# Patient Record
Sex: Female | Born: 1955 | Race: White | Hispanic: No | Marital: Married | Smoking: Never smoker
Health system: Southern US, Community
[De-identification: ages and names within clinical notes are randomized; demographics above are authoritative.]

---

## 1998-04-21 ENCOUNTER — Ambulatory Visit (HOSPITAL_COMMUNITY): Admission: RE | Admit: 1998-04-21 | Discharge: 1998-04-21 | Payer: Self-pay | Admitting: Obstetrics & Gynecology

## 1998-06-19 ENCOUNTER — Ambulatory Visit (HOSPITAL_COMMUNITY): Admission: RE | Admit: 1998-06-19 | Discharge: 1998-06-19 | Payer: Self-pay | Admitting: Obstetrics & Gynecology

## 1999-03-24 ENCOUNTER — Other Ambulatory Visit: Admission: RE | Admit: 1999-03-24 | Discharge: 1999-03-24 | Payer: Self-pay | Admitting: Obstetrics & Gynecology

## 2000-04-03 ENCOUNTER — Encounter: Payer: Self-pay | Admitting: Obstetrics & Gynecology

## 2000-04-03 ENCOUNTER — Ambulatory Visit (HOSPITAL_COMMUNITY): Admission: RE | Admit: 2000-04-03 | Discharge: 2000-04-03 | Payer: Self-pay | Admitting: Obstetrics & Gynecology

## 2000-08-02 ENCOUNTER — Other Ambulatory Visit: Admission: RE | Admit: 2000-08-02 | Discharge: 2000-08-02 | Payer: Self-pay | Admitting: Obstetrics & Gynecology

## 2000-09-18 ENCOUNTER — Inpatient Hospital Stay (HOSPITAL_COMMUNITY): Admission: RE | Admit: 2000-09-18 | Discharge: 2000-09-20 | Payer: Self-pay | Admitting: Obstetrics & Gynecology

## 2000-09-20 ENCOUNTER — Inpatient Hospital Stay (HOSPITAL_COMMUNITY): Admission: EM | Admit: 2000-09-20 | Discharge: 2000-09-22 | Payer: Self-pay | Admitting: Emergency Medicine

## 2001-09-17 ENCOUNTER — Other Ambulatory Visit: Admission: RE | Admit: 2001-09-17 | Discharge: 2001-09-17 | Payer: Self-pay | Admitting: Obstetrics & Gynecology

## 2002-08-28 ENCOUNTER — Ambulatory Visit (HOSPITAL_COMMUNITY): Admission: RE | Admit: 2002-08-28 | Discharge: 2002-08-28 | Payer: Self-pay | Admitting: Obstetrics & Gynecology

## 2002-08-28 ENCOUNTER — Encounter: Payer: Self-pay | Admitting: Obstetrics & Gynecology

## 2002-10-18 ENCOUNTER — Other Ambulatory Visit: Admission: RE | Admit: 2002-10-18 | Discharge: 2002-10-18 | Payer: Self-pay | Admitting: Obstetrics & Gynecology

## 2003-11-04 ENCOUNTER — Other Ambulatory Visit: Admission: RE | Admit: 2003-11-04 | Discharge: 2003-11-04 | Payer: Self-pay | Admitting: Obstetrics & Gynecology

## 2005-02-09 ENCOUNTER — Other Ambulatory Visit: Admission: RE | Admit: 2005-02-09 | Discharge: 2005-02-09 | Payer: Self-pay | Admitting: Obstetrics & Gynecology

## 2006-02-15 ENCOUNTER — Other Ambulatory Visit: Admission: RE | Admit: 2006-02-15 | Discharge: 2006-02-15 | Payer: Self-pay | Admitting: Obstetrics & Gynecology

## 2014-12-04 ENCOUNTER — Other Ambulatory Visit: Payer: Self-pay | Admitting: Obstetrics & Gynecology

## 2014-12-04 DIAGNOSIS — R928 Other abnormal and inconclusive findings on diagnostic imaging of breast: Secondary | ICD-10-CM

## 2014-12-15 ENCOUNTER — Ambulatory Visit
Admission: RE | Admit: 2014-12-15 | Discharge: 2014-12-15 | Disposition: A | Discharge status: Home / Self Care | Payer: 59 | Source: Ambulatory Visit | Attending: Obstetrics & Gynecology | Admitting: Obstetrics & Gynecology

## 2014-12-15 DIAGNOSIS — R928 Other abnormal and inconclusive findings on diagnostic imaging of breast: Secondary | ICD-10-CM

## 2015-05-25 ENCOUNTER — Other Ambulatory Visit: Payer: Self-pay | Admitting: Obstetrics & Gynecology

## 2015-05-25 DIAGNOSIS — N6489 Other specified disorders of breast: Secondary | ICD-10-CM

## 2015-06-16 ENCOUNTER — Ambulatory Visit
Admission: RE | Admit: 2015-06-16 | Discharge: 2015-06-16 | Disposition: A | Discharge status: Home / Self Care | Payer: Commercial Managed Care - HMO | Source: Ambulatory Visit | Attending: Obstetrics & Gynecology | Admitting: Obstetrics & Gynecology

## 2015-06-16 DIAGNOSIS — N6489 Other specified disorders of breast: Secondary | ICD-10-CM

## 2015-11-09 ENCOUNTER — Other Ambulatory Visit: Payer: Self-pay

## 2015-11-09 DIAGNOSIS — Z1231 Encounter for screening mammogram for malignant neoplasm of breast: Secondary | ICD-10-CM

## 2015-12-14 ENCOUNTER — Other Ambulatory Visit: Payer: Self-pay | Admitting: Obstetrics & Gynecology

## 2015-12-14 ENCOUNTER — Ambulatory Visit
Admission: RE | Admit: 2015-12-14 | Discharge: 2015-12-14 | Disposition: A | Discharge status: Home / Self Care | Payer: Commercial Managed Care - HMO | Source: Ambulatory Visit

## 2015-12-14 DIAGNOSIS — N6489 Other specified disorders of breast: Secondary | ICD-10-CM

## 2015-12-14 DIAGNOSIS — Z1231 Encounter for screening mammogram for malignant neoplasm of breast: Secondary | ICD-10-CM

## 2015-12-28 ENCOUNTER — Ambulatory Visit
Admission: RE | Admit: 2015-12-28 | Discharge: 2015-12-28 | Disposition: A | Discharge status: Home / Self Care | Payer: Commercial Managed Care - HMO | Source: Ambulatory Visit | Attending: Obstetrics & Gynecology | Admitting: Obstetrics & Gynecology

## 2015-12-28 DIAGNOSIS — N6489 Other specified disorders of breast: Secondary | ICD-10-CM

## 2016-12-06 ENCOUNTER — Other Ambulatory Visit: Payer: Self-pay | Admitting: Obstetrics & Gynecology

## 2016-12-06 DIAGNOSIS — N644 Mastodynia: Secondary | ICD-10-CM

## 2016-12-07 DIAGNOSIS — Z01419 Encounter for gynecological examination (general) (routine) without abnormal findings: Secondary | ICD-10-CM | POA: Diagnosis not present

## 2016-12-09 ENCOUNTER — Other Ambulatory Visit: Payer: Self-pay | Admitting: Obstetrics & Gynecology

## 2016-12-09 DIAGNOSIS — N644 Mastodynia: Secondary | ICD-10-CM

## 2016-12-28 ENCOUNTER — Ambulatory Visit
Admission: RE | Admit: 2016-12-28 | Discharge: 2016-12-28 | Disposition: A | Discharge status: Home / Self Care | Payer: Commercial Managed Care - HMO | Source: Ambulatory Visit | Attending: Obstetrics & Gynecology | Admitting: Obstetrics & Gynecology

## 2016-12-28 DIAGNOSIS — N644 Mastodynia: Secondary | ICD-10-CM

## 2017-02-08 DIAGNOSIS — R030 Elevated blood-pressure reading, without diagnosis of hypertension: Secondary | ICD-10-CM | POA: Diagnosis not present

## 2017-02-08 DIAGNOSIS — E038 Other specified hypothyroidism: Secondary | ICD-10-CM | POA: Diagnosis not present

## 2017-02-28 DIAGNOSIS — Z23 Encounter for immunization: Secondary | ICD-10-CM | POA: Diagnosis not present

## 2017-06-19 DIAGNOSIS — L03031 Cellulitis of right toe: Secondary | ICD-10-CM | POA: Diagnosis not present

## 2017-08-09 ENCOUNTER — Ambulatory Visit (INDEPENDENT_AMBULATORY_CARE_PROVIDER_SITE_OTHER): Payer: 59 | Admitting: Orthopedic Surgery

## 2017-08-09 ENCOUNTER — Encounter (INDEPENDENT_AMBULATORY_CARE_PROVIDER_SITE_OTHER): Payer: Self-pay | Admitting: Orthopedic Surgery

## 2017-08-09 ENCOUNTER — Ambulatory Visit (INDEPENDENT_AMBULATORY_CARE_PROVIDER_SITE_OTHER): Payer: 59

## 2017-08-09 DIAGNOSIS — M25561 Pain in right knee: Secondary | ICD-10-CM

## 2017-08-12 DIAGNOSIS — M25561 Pain in right knee: Secondary | ICD-10-CM | POA: Diagnosis not present

## 2017-08-12 MED ORDER — LIDOCAINE HCL 1 % IJ SOLN
5.0000 mL | INTRAMUSCULAR | Status: AC | PRN
  Administered 2017-08-12: 5 mL

## 2017-08-12 MED ORDER — METHYLPREDNISOLONE ACETATE 40 MG/ML IJ SUSP
40.0000 mg | INTRAMUSCULAR | Status: AC | PRN
  Administered 2017-08-12: 40 mg via INTRA_ARTICULAR

## 2017-08-12 MED ORDER — BUPIVACAINE HCL 0.25 % IJ SOLN
4.0000 mL | INTRAMUSCULAR | Status: AC | PRN
  Administered 2017-08-12: 4 mL via INTRA_ARTICULAR

## 2017-08-12 NOTE — Progress Notes (Signed)
Office Visit Note   Patient: Whitney Small           Date of Birth: August 21, 1956           MRN: [id number] Visit Date: 08/09/2017 Requested by: No referring provider defined for this encounter. PCP: Alysia Penna, MD  Subjective: Chief Complaint  Patient presents with  . Right Knee - Pain    HPI: Whitney Small is a 61 year old female with 6 week history of right knee pain.  She had been cleaning painting exercising walking on vacation and that she was at her mother's and had a deep knee squat and felt a pop which has affected the medial aspect of her knee.  She is retired.  She takes ibuprofen for the problem.  She is been doing training on the bike.  When she walks too fast since that accident she reports sharp pain on the side of her knee.  She is going to [place] in November which involves a lot of walking around.  No waking from sleep from the pain.              ROS: All systems reviewed are negative as they relate to the chief complaint within the history of present illness.  Patient denies  fevers or chills.   Assessment & Plan: Visit Diagnoses:  1. Right knee pain, unspecified chronicity     Plan: Impression is pop right knee medial tenderness and mechanism is consistent with medial meniscal tear and normal radiographs.  She does have a trace effusion in the knee.  Pain is relatively severe.  Plan at this time is to aspirate and inject that knee and obtained an MRI scan of the knee.  Looking for meniscal root tear versus degenerative tear of the meniscus I'll see her back after that study  Follow-Up Instructions: Return for after MRI.   Orders:  Orders Placed This Encounter  Procedures  . XR KNEE 3 VIEW RIGHT  . MR Knee Right w/o contrast   No orders of the defined types were placed in this encounter.     Procedures: Large Joint Inj Date/Time: 08/12/2017 12:32 PM Performed by: Cammy Copa Authorized by: Cammy Copa   Consent Given by:   Patient Site marked: the procedure site was marked   Timeout: prior to procedure the correct patient, procedure, and site was verified   Indications:  Pain, joint swelling and diagnostic evaluation Location:  Knee Site:  R knee Prep: patient was prepped and draped in usual sterile fashion   Needle Size:  18 G Needle Length:  1.5 inches Approach:  Superolateral Ultrasound Guidance: No   Fluoroscopic Guidance: No   Arthrogram: No   Medications:  5 mL lidocaine 1 %; 4 mL bupivacaine 0.25 %; 40 mg methylPREDNISolone acetate 40 MG/ML Aspiration Attempted: Yes   Aspirate amount (mL):  20 Patient tolerance:  Patient tolerated the procedure well with no immediate complications     Clinical Data: No additional findings.  Objective: Vital Signs: There were no vitals taken for this visit.  Physical Exam:   Constitutional: Patient appears well-developed HEENT:  Head: Normocephalic Eyes:EOM are normal Neck: Normal range of motion Cardiovascular: Normal rate Pulmonary/chest: Effort normal Neurologic: Patient is alert Skin: Skin is warm Psychiatric: Patient has normal mood and affect    Ortho Exam: Orthopedic exam demonstrates full active and passive range of motion of the knee with mild effusion stable collateral and cruciate ligaments positive McMurray compression testing for medial compartment pathology intact  anterior cruciate ligament and PCL no other masses lymph adenopathy or skin changes noted in the right knee region  Specialty Comments:  No specialty comments available.  Imaging: No results found.   PMFS History: There are no active problems to display for this patient.  No past medical history on file.  No family history on file.  No past surgical history on file. Social History   Occupational History  . Not on file.   Social History Main Topics  . Smoking status: Never Smoker  . Smokeless tobacco: Never Used  . Alcohol use Not on file  . Drug use:  Unknown  . Sexual activity: Not on file

## 2017-08-19 ENCOUNTER — Ambulatory Visit
Admission: RE | Admit: 2017-08-19 | Discharge: 2017-08-19 | Disposition: A | Discharge status: Home / Self Care | Payer: Commercial Managed Care - HMO | Source: Ambulatory Visit | Attending: Orthopedic Surgery | Admitting: Orthopedic Surgery

## 2017-08-19 DIAGNOSIS — M25461 Effusion, right knee: Secondary | ICD-10-CM | POA: Diagnosis not present

## 2017-08-19 DIAGNOSIS — M25561 Pain in right knee: Secondary | ICD-10-CM

## 2017-08-23 ENCOUNTER — Encounter (INDEPENDENT_AMBULATORY_CARE_PROVIDER_SITE_OTHER): Payer: Self-pay | Admitting: Orthopedic Surgery

## 2017-08-23 ENCOUNTER — Ambulatory Visit (INDEPENDENT_AMBULATORY_CARE_PROVIDER_SITE_OTHER): Payer: 59 | Admitting: Orthopedic Surgery

## 2017-08-23 DIAGNOSIS — M25561 Pain in right knee: Secondary | ICD-10-CM

## 2017-08-26 NOTE — Progress Notes (Signed)
Office Visit Note   Patient: Whitney Small           Date of Birth: Aug 24, 1956           MRN: [id number] Visit Date: 08/23/2017 Requested by: Alysia Penna, MD [address] [place], [place] [address] PCP: Alysia Penna, MD  Subjective: Chief Complaint  Patient presents with  . Right Knee - Follow-up    HPI: Whitney Small is a 61 year old patient with right knee pain.  MRI scan has been done since I have seen her.  Shows mild to moderate chondrosis but no definite meniscal tear.  Injection 08/09/2017 helped a lot.  She has watched her grandchildren play soccer.  Currently on no medication for the problem              ROS: All systems reviewed are negative as they relate to the chief complaint within the history of present illness.  Patient denies  fevers or chills.   Assessment & Plan: Visit Diagnoses:  1. Right knee pain, unspecified chronicity     Plan: Impression is right knee pain moderate chondrosis by MRI scan with significant improvement in symptoms from injection.  No operative intervention at this time in the right knee.  Continue with nonweightbearing quad strengthening exercises and follow up as needed  Follow-Up Instructions: Return if symptoms worsen or fail to improve.   Orders:  No orders of the defined types were placed in this encounter.  No orders of the defined types were placed in this encounter.     Procedures: No procedures performed   Clinical Data: No additional findings.  Objective: Vital Signs: There were no vitals taken for this visit.  Physical Exam:   Constitutional: Patient appears well-developed HEENT:  Head: Normocephalic Eyes:EOM are normal Neck: Normal range of motion Cardiovascular: Normal rate Pulmonary/chest: Effort normal Neurologic: Patient is alert Skin: Skin is warm Psychiatric: Patient has normal mood and affect    Ortho Exam: Orthopedic examination of the right knee demonstrates no effusion full range of  motion stable collateral crucial ligaments intact extensor mechanism palpable pedal pulses no masses lymph adenopathy or skin changes noted in the right knee region  Specialty Comments:  No specialty comments available.  Imaging: No results found.   PMFS History: There are no active problems to display for this patient.  No past medical history on file.  No family history on file.  No past surgical history on file. Social History   Occupational History  . Not on file.   Social History Main Topics  . Smoking status: Never Smoker  . Smokeless tobacco: Never Used  . Alcohol use Not on file  . Drug use: Unknown  . Sexual activity: Not on file

## 2017-09-04 DIAGNOSIS — Z23 Encounter for immunization: Secondary | ICD-10-CM | POA: Diagnosis not present

## 2017-09-04 DIAGNOSIS — E039 Hypothyroidism, unspecified: Secondary | ICD-10-CM | POA: Diagnosis not present

## 2017-10-04 DIAGNOSIS — Z0000 Encounter for general adult medical examination without abnormal findings: Secondary | ICD-10-CM | POA: Diagnosis not present

## 2017-10-11 DIAGNOSIS — Z0000 Encounter for general adult medical examination without abnormal findings: Secondary | ICD-10-CM | POA: Diagnosis not present

## 2017-10-11 DIAGNOSIS — M25461 Effusion, right knee: Secondary | ICD-10-CM | POA: Diagnosis not present

## 2017-10-11 DIAGNOSIS — E038 Other specified hypothyroidism: Secondary | ICD-10-CM | POA: Diagnosis not present

## 2017-10-11 DIAGNOSIS — Z1389 Encounter for screening for other disorder: Secondary | ICD-10-CM | POA: Diagnosis not present

## 2017-10-12 ENCOUNTER — Ambulatory Visit (INDEPENDENT_AMBULATORY_CARE_PROVIDER_SITE_OTHER): Payer: 59 | Admitting: Orthopedic Surgery

## 2017-10-12 ENCOUNTER — Encounter (INDEPENDENT_AMBULATORY_CARE_PROVIDER_SITE_OTHER): Payer: Self-pay | Admitting: Orthopedic Surgery

## 2017-10-12 DIAGNOSIS — M25561 Pain in right knee: Secondary | ICD-10-CM

## 2017-10-12 MED ORDER — BUPIVACAINE HCL 0.25 % IJ SOLN
4.0000 mL | INTRAMUSCULAR | Status: AC | PRN
  Administered 2017-10-12: 4 mL via INTRA_ARTICULAR

## 2017-10-12 MED ORDER — METHYLPREDNISOLONE ACETATE 40 MG/ML IJ SUSP
40.0000 mg | INTRAMUSCULAR | Status: AC | PRN
Start: 2017-10-12 — End: 2017-10-12
  Administered 2017-10-12: 40 mg via INTRA_ARTICULAR

## 2017-10-12 MED ORDER — LIDOCAINE HCL 1 % IJ SOLN
5.0000 mL | INTRAMUSCULAR | Status: AC | PRN
  Administered 2017-10-12: 5 mL

## 2017-10-12 MED ORDER — IBUPROFEN-FAMOTIDINE 800-26.6 MG PO TABS: ORAL_TABLET | ORAL | 0 refills | Status: AC

## 2017-10-12 NOTE — Progress Notes (Signed)
Office Visit Note   Patient: Whitney Small           Date of Birth: September 01, 1956           MRN: [id number] Visit Date: 10/12/2017 Requested by: Alysia Penna, MD [address] [place], [place] [address] PCP: Alysia Penna, MD  Subjective: Chief Complaint  Patient presents with  . Right Knee - Follow-up    HPI: Whitney Small is a 61 year old patient with right knee pain.  She had the knee aspirated and injected in September.  This helps.  She is going to [employer] in a week and will be doing a lot of walking.  She reports swelling tightness and difficulty bending in the right knee.              ROS: All systems reviewed are negative as they relate to the chief complaint within the history of present illness.  Patient denies  fevers or chills.   Assessment & Plan: Visit Diagnoses:  1. Right knee pain, unspecified chronicity     Plan: Impression is right knee pain and effusion with flareup of arthritis.  Plan is aspiration and injection today along with Duexis prescription.  I will see her back as needed.  Continue with nonweightbearing quad strengthening exercises  Follow-Up Instructions: Return if symptoms worsen or fail to improve.   Orders:  No orders of the defined types were placed in this encounter.  Meds ordered this encounter  Medications  . Ibuprofen-Famotidine (DUEXIS) 800-26.6 MG TABS    Sig: 1 po qd to bid prn    Dispense:  60 tablet    Refill:  0      Procedures: Large Joint Inj: R knee on 10/12/2017 2:46 PM Indications: diagnostic evaluation, joint swelling and pain Details: 18 G 1.5 in needle, superolateral approach  Arthrogram: No  Medications: 5 mL lidocaine 1 %; 40 mg methylPREDNISolone acetate 40 MG/ML; 4 mL bupivacaine 0.25 % Aspirate: 30 mL yellow Outcome: tolerated well, no immediate complications Procedure, treatment alternatives, risks and benefits explained, specific risks discussed. Consent was given by the patient. Immediately prior to  procedure a time out was called to verify the correct patient, procedure, equipment, support staff and site/side marked as required. Patient was prepped and draped in the usual sterile fashion.       Clinical Data: No additional findings.  Objective: Vital Signs: There were no vitals taken for this visit.  Physical Exam:   Constitutional: Patient appears well-developed HEENT:  Head: Normocephalic Eyes:EOM are normal Neck: Normal range of motion Cardiovascular: Normal rate Pulmonary/chest: Effort normal Neurologic: Patient is alert Skin: Skin is warm Psychiatric: Patient has normal mood and affect    Ortho Exam: Orthopedic exam demonstrates full active and passive range of motion of the right knee with mild effusion present in collateral crucial ligaments are stable extensor mechanism is intact.  No other masses lymph adenopathy or skin changes noted in the right knee region  Specialty Comments:  No specialty comments available.  Imaging: No results found.   PMFS History: There are no active problems to display for this patient.  History reviewed. No pertinent past medical history.  History reviewed. No pertinent family history.  History reviewed. No pertinent surgical history. Social History   Occupational History  . Not on file  Tobacco Use  . Smoking status: Never Smoker  . Smokeless tobacco: Never Used  Substance and Sexual Activity  . Alcohol use: Not on file  . Drug use: Not on file  .  Sexual activity: Not on file

## 2017-12-11 DIAGNOSIS — Z01419 Encounter for gynecological examination (general) (routine) without abnormal findings: Secondary | ICD-10-CM | POA: Diagnosis not present

## 2018-02-06 ENCOUNTER — Other Ambulatory Visit: Payer: Self-pay | Admitting: Obstetrics & Gynecology

## 2018-02-06 DIAGNOSIS — Z1231 Encounter for screening mammogram for malignant neoplasm of breast: Secondary | ICD-10-CM

## 2018-02-12 ENCOUNTER — Ambulatory Visit
Admission: RE | Admit: 2018-02-12 | Discharge: 2018-02-12 | Disposition: A | Discharge status: Home / Self Care | Payer: 59 | Source: Ambulatory Visit | Attending: Obstetrics & Gynecology | Admitting: Obstetrics & Gynecology

## 2018-02-12 DIAGNOSIS — Z1231 Encounter for screening mammogram for malignant neoplasm of breast: Secondary | ICD-10-CM

## 2018-08-30 DIAGNOSIS — Z6825 Body mass index (BMI) 25.0-25.9, adult: Secondary | ICD-10-CM | POA: Diagnosis not present

## 2018-08-30 DIAGNOSIS — R6889 Other general symptoms and signs: Secondary | ICD-10-CM | POA: Diagnosis not present

## 2018-09-05 DIAGNOSIS — E039 Hypothyroidism, unspecified: Secondary | ICD-10-CM | POA: Diagnosis not present

## 2018-09-05 DIAGNOSIS — Z23 Encounter for immunization: Secondary | ICD-10-CM | POA: Diagnosis not present

## 2018-10-05 DIAGNOSIS — Z0000 Encounter for general adult medical examination without abnormal findings: Secondary | ICD-10-CM | POA: Diagnosis not present

## 2018-10-10 DIAGNOSIS — R6889 Other general symptoms and signs: Secondary | ICD-10-CM | POA: Diagnosis not present

## 2018-10-10 DIAGNOSIS — Z0000 Encounter for general adult medical examination without abnormal findings: Secondary | ICD-10-CM | POA: Diagnosis not present

## 2018-10-10 DIAGNOSIS — E038 Other specified hypothyroidism: Secondary | ICD-10-CM | POA: Diagnosis not present

## 2018-10-10 DIAGNOSIS — Z1389 Encounter for screening for other disorder: Secondary | ICD-10-CM | POA: Diagnosis not present

## 2018-12-18 DIAGNOSIS — Z01419 Encounter for gynecological examination (general) (routine) without abnormal findings: Secondary | ICD-10-CM | POA: Diagnosis not present

## 2018-12-18 DIAGNOSIS — Z6825 Body mass index (BMI) 25.0-25.9, adult: Secondary | ICD-10-CM | POA: Diagnosis not present

## 2018-12-25 DIAGNOSIS — Z1212 Encounter for screening for malignant neoplasm of rectum: Secondary | ICD-10-CM | POA: Diagnosis not present

## 2018-12-25 DIAGNOSIS — Z1211 Encounter for screening for malignant neoplasm of colon: Secondary | ICD-10-CM | POA: Diagnosis not present

## 2019-01-03 ENCOUNTER — Other Ambulatory Visit: Payer: Self-pay | Admitting: Obstetrics & Gynecology

## 2019-01-03 DIAGNOSIS — Z1231 Encounter for screening mammogram for malignant neoplasm of breast: Secondary | ICD-10-CM

## 2019-02-15 ENCOUNTER — Ambulatory Visit: Payer: 59

## 2019-03-11 ENCOUNTER — Ambulatory Visit: Payer: 59

## 2019-04-23 ENCOUNTER — Other Ambulatory Visit: Payer: Self-pay

## 2019-04-23 ENCOUNTER — Ambulatory Visit
Admission: RE | Admit: 2019-04-23 | Discharge: 2019-04-23 | Disposition: A | Discharge status: Home / Self Care | Payer: 59 | Source: Ambulatory Visit | Attending: Obstetrics & Gynecology | Admitting: Obstetrics & Gynecology

## 2019-04-23 DIAGNOSIS — Z1231 Encounter for screening mammogram for malignant neoplasm of breast: Secondary | ICD-10-CM | POA: Diagnosis not present

## 2020-06-15 ENCOUNTER — Other Ambulatory Visit: Payer: Self-pay | Admitting: Obstetrics & Gynecology

## 2020-06-15 DIAGNOSIS — Z1231 Encounter for screening mammogram for malignant neoplasm of breast: Secondary | ICD-10-CM

## 2020-06-23 ENCOUNTER — Other Ambulatory Visit: Payer: Self-pay

## 2020-06-23 ENCOUNTER — Ambulatory Visit
Admission: RE | Admit: 2020-06-23 | Discharge: 2020-06-23 | Disposition: A | Discharge status: Home / Self Care | Payer: 59 | Source: Ambulatory Visit | Attending: Obstetrics & Gynecology | Admitting: Obstetrics & Gynecology

## 2020-06-23 DIAGNOSIS — Z1231 Encounter for screening mammogram for malignant neoplasm of breast: Secondary | ICD-10-CM

## 2020-09-07 DIAGNOSIS — E039 Hypothyroidism, unspecified: Secondary | ICD-10-CM | POA: Diagnosis not present

## 2020-09-14 DIAGNOSIS — Z23 Encounter for immunization: Secondary | ICD-10-CM | POA: Diagnosis not present

## 2020-09-14 DIAGNOSIS — E039 Hypothyroidism, unspecified: Secondary | ICD-10-CM | POA: Diagnosis not present

## 2020-10-23 DIAGNOSIS — E039 Hypothyroidism, unspecified: Secondary | ICD-10-CM | POA: Diagnosis not present

## 2020-10-30 DIAGNOSIS — Z0000 Encounter for general adult medical examination without abnormal findings: Secondary | ICD-10-CM | POA: Diagnosis not present

## 2020-10-30 DIAGNOSIS — E039 Hypothyroidism, unspecified: Secondary | ICD-10-CM | POA: Diagnosis not present

## 2020-12-20 IMAGING — MG DIGITAL SCREENING BILAT W/ TOMO W/ CAD
8 series · 8 of 24 positions shown · non-contrast
Comparison: Previous exam(s).

CLINICAL DATA: Screening.

EXAM:
DIGITAL SCREENING BILATERAL MAMMOGRAM WITH TOMO AND CAD

[L CC synth-2D]
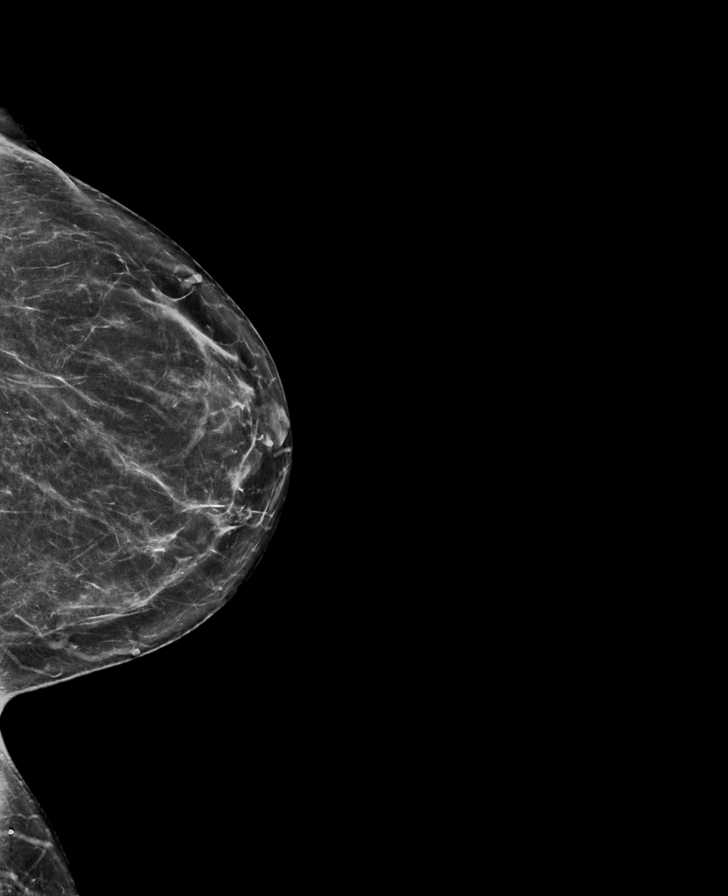

[L MLO synth-2D]
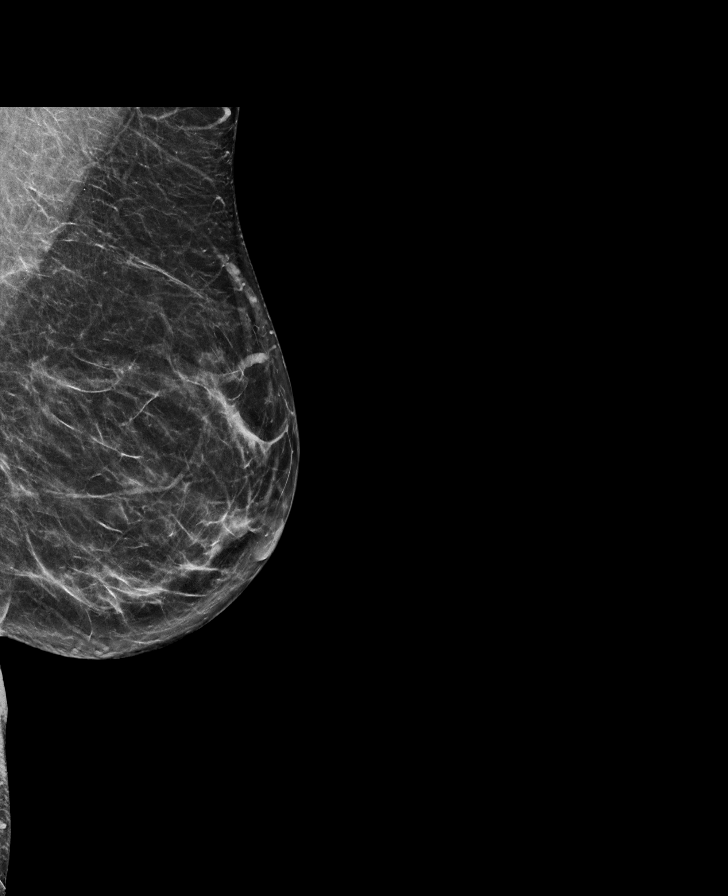

[R CC synth-2D]
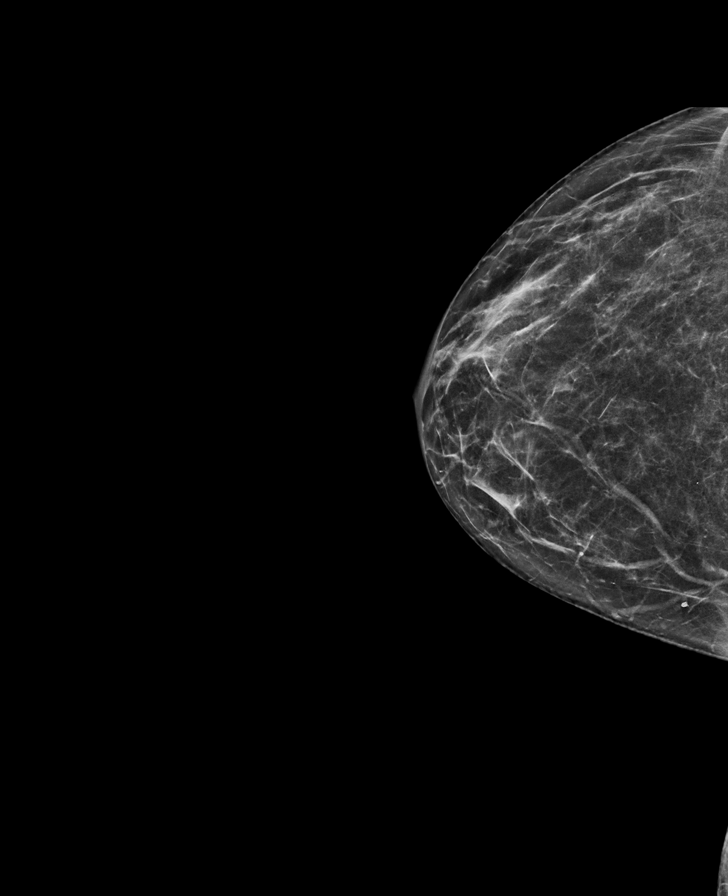

[R MLO synth-2D]
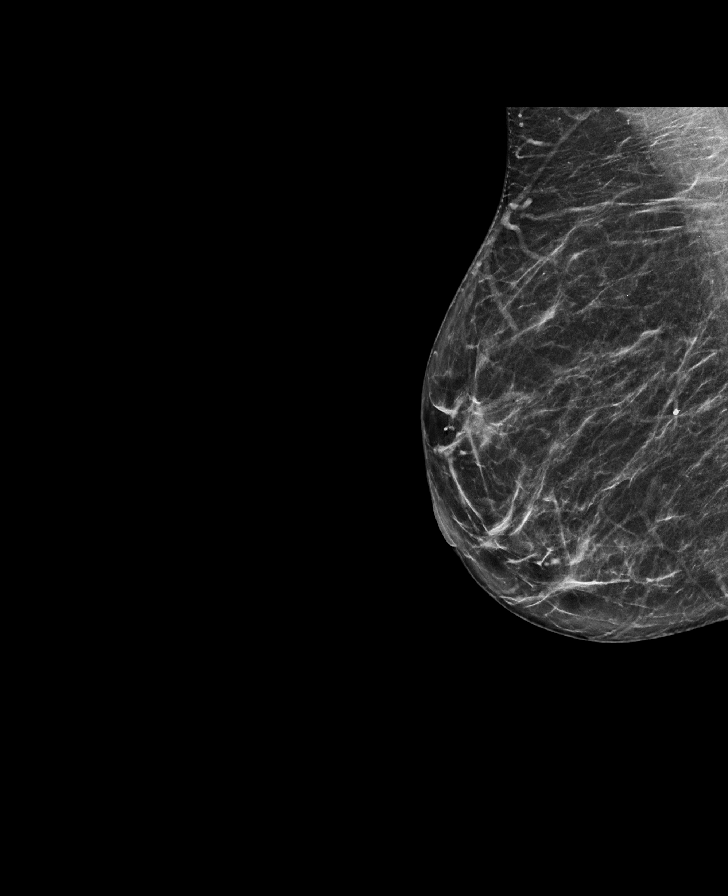

[L CC tomo · tomo slice 34/67.0]
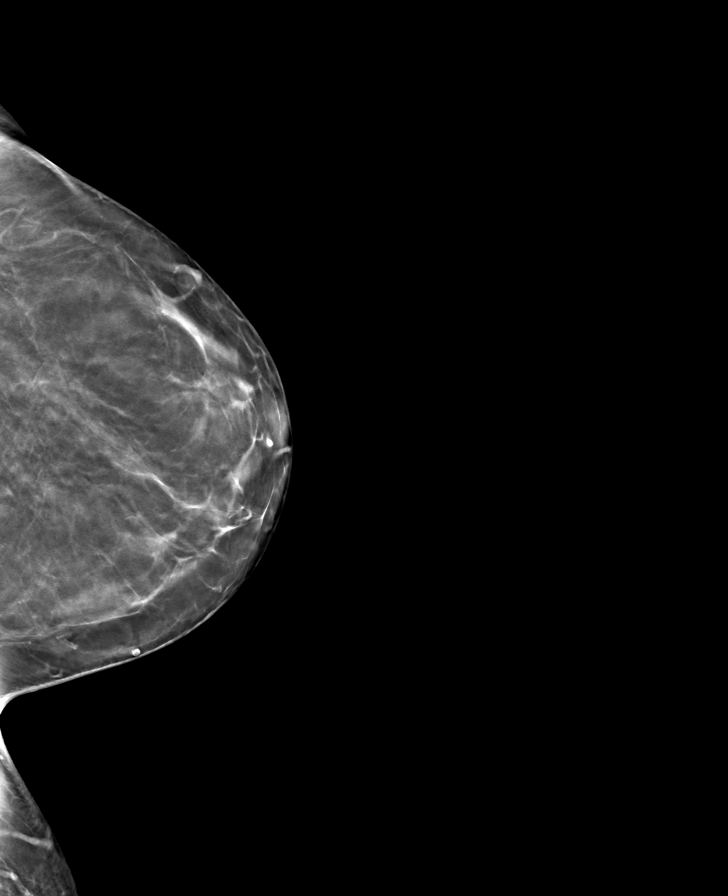

[R MLO tomo · tomo slice 33/65.0]
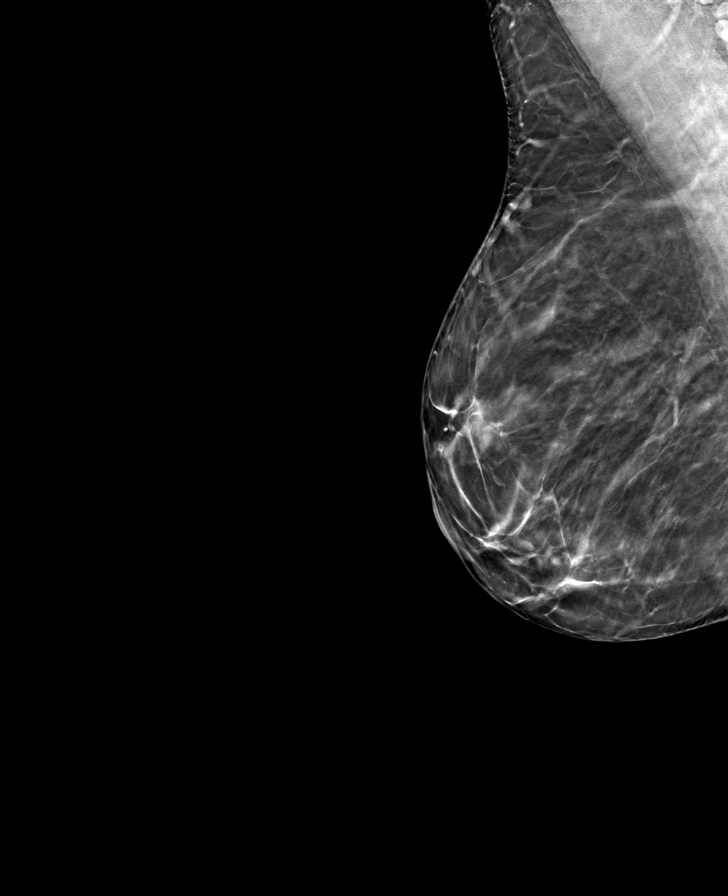

[L MLO tomo · tomo slice 35/68.0]
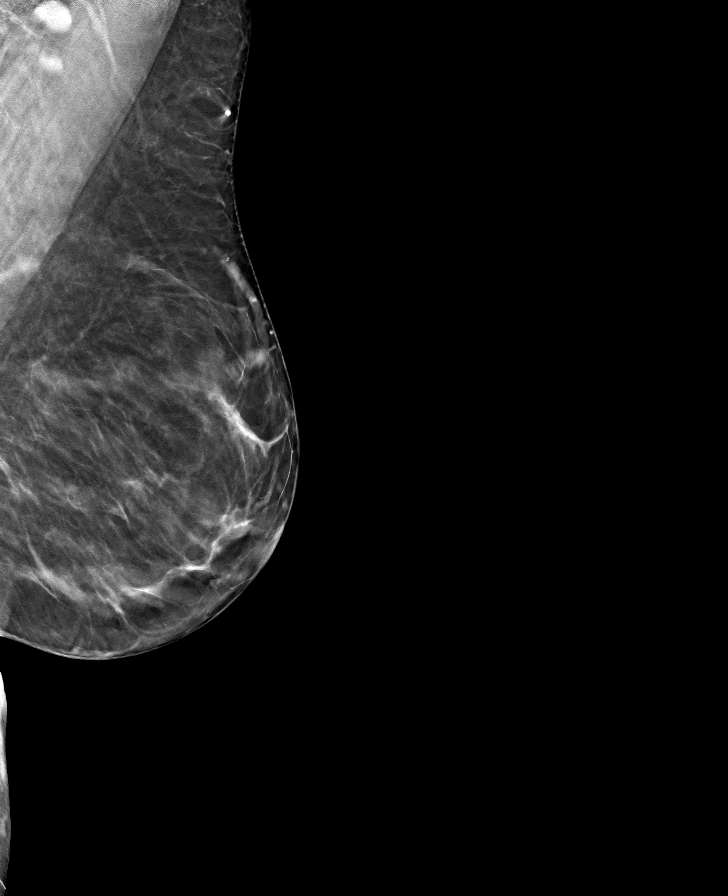

[R CC tomo · tomo slice 32/63.0]
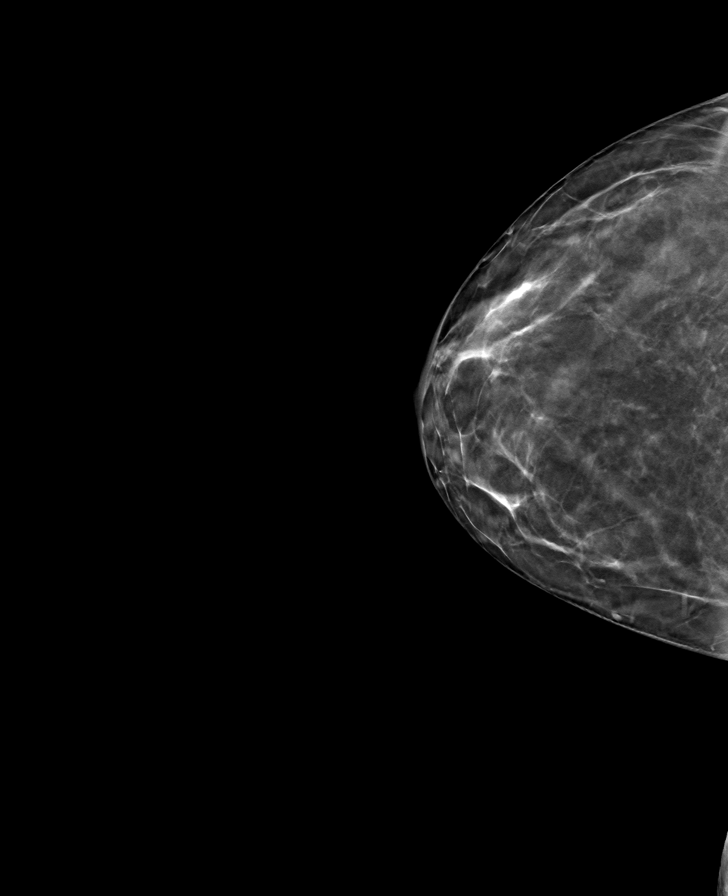

[8 of 24 positions shown; findings below may reference images not displayed]

ACR Breast Density Category c: The breast tissue is heterogeneously
dense, which may obscure small masses.
FINDINGS: There are no findings suspicious for malignancy. Images were
processed with CAD.
IMPRESSION: No mammographic evidence of malignancy. A result letter of this
screening mammogram will be mailed directly to the patient.

RECOMMENDATION:
Screening mammogram in one year. (Code:FT-U-LHB)

BI-RADS CATEGORY  1: Negative.

## 2020-12-28 DIAGNOSIS — Z01419 Encounter for gynecological examination (general) (routine) without abnormal findings: Secondary | ICD-10-CM | POA: Diagnosis not present

## 2020-12-28 DIAGNOSIS — Z6826 Body mass index (BMI) 26.0-26.9, adult: Secondary | ICD-10-CM | POA: Diagnosis not present

## 2021-08-31 DIAGNOSIS — E039 Hypothyroidism, unspecified: Secondary | ICD-10-CM | POA: Diagnosis not present

## 2021-09-14 DIAGNOSIS — Z23 Encounter for immunization: Secondary | ICD-10-CM | POA: Diagnosis not present

## 2021-09-14 DIAGNOSIS — E039 Hypothyroidism, unspecified: Secondary | ICD-10-CM | POA: Diagnosis not present

## 2021-11-30 DIAGNOSIS — E039 Hypothyroidism, unspecified: Secondary | ICD-10-CM | POA: Diagnosis not present

## 2021-11-30 DIAGNOSIS — R030 Elevated blood-pressure reading, without diagnosis of hypertension: Secondary | ICD-10-CM | POA: Diagnosis not present

## 2021-11-30 DIAGNOSIS — R7989 Other specified abnormal findings of blood chemistry: Secondary | ICD-10-CM | POA: Diagnosis not present

## 2021-12-07 DIAGNOSIS — Z0000 Encounter for general adult medical examination without abnormal findings: Secondary | ICD-10-CM | POA: Diagnosis not present

## 2021-12-07 DIAGNOSIS — E039 Hypothyroidism, unspecified: Secondary | ICD-10-CM | POA: Diagnosis not present

## 2021-12-07 DIAGNOSIS — Z1331 Encounter for screening for depression: Secondary | ICD-10-CM | POA: Diagnosis not present

## 2021-12-07 DIAGNOSIS — Z1339 Encounter for screening examination for other mental health and behavioral disorders: Secondary | ICD-10-CM | POA: Diagnosis not present

## 2022-01-03 ENCOUNTER — Other Ambulatory Visit: Payer: Self-pay | Admitting: Obstetrics & Gynecology

## 2022-01-03 DIAGNOSIS — Z803 Family history of malignant neoplasm of breast: Secondary | ICD-10-CM

## 2022-01-20 DIAGNOSIS — Z124 Encounter for screening for malignant neoplasm of cervix: Secondary | ICD-10-CM | POA: Diagnosis not present

## 2022-01-20 DIAGNOSIS — Z1231 Encounter for screening mammogram for malignant neoplasm of breast: Secondary | ICD-10-CM | POA: Diagnosis not present

## 2022-01-20 DIAGNOSIS — Z1272 Encounter for screening for malignant neoplasm of vagina: Secondary | ICD-10-CM | POA: Diagnosis not present

## 2022-01-20 DIAGNOSIS — Z6826 Body mass index (BMI) 26.0-26.9, adult: Secondary | ICD-10-CM | POA: Diagnosis not present

## 2022-01-26 DIAGNOSIS — M8588 Other specified disorders of bone density and structure, other site: Secondary | ICD-10-CM | POA: Diagnosis not present

## 2022-01-26 DIAGNOSIS — Z8262 Family history of osteoporosis: Secondary | ICD-10-CM | POA: Diagnosis not present

## 2022-01-26 DIAGNOSIS — N958 Other specified menopausal and perimenopausal disorders: Secondary | ICD-10-CM | POA: Diagnosis not present

## 2022-03-01 DIAGNOSIS — L820 Inflamed seborrheic keratosis: Secondary | ICD-10-CM | POA: Diagnosis not present

## 2022-07-07 ENCOUNTER — Other Ambulatory Visit: Payer: Self-pay | Admitting: Endocrinology

## 2022-07-07 DIAGNOSIS — Z808 Family history of malignant neoplasm of other organs or systems: Secondary | ICD-10-CM

## 2022-07-08 ENCOUNTER — Ambulatory Visit
Admission: RE | Admit: 2022-07-08 | Discharge: 2022-07-08 | Disposition: A | Discharge status: Home / Self Care | Payer: PPO | Source: Ambulatory Visit | Attending: Endocrinology | Admitting: Endocrinology

## 2022-07-08 DIAGNOSIS — Z808 Family history of malignant neoplasm of other organs or systems: Secondary | ICD-10-CM

## 2022-07-08 DIAGNOSIS — E041 Nontoxic single thyroid nodule: Secondary | ICD-10-CM | POA: Diagnosis not present

## 2022-07-13 ENCOUNTER — Other Ambulatory Visit: Payer: Self-pay | Admitting: Obstetrics & Gynecology

## 2022-07-13 DIAGNOSIS — Z803 Family history of malignant neoplasm of breast: Secondary | ICD-10-CM

## 2022-07-24 ENCOUNTER — Ambulatory Visit
Admission: RE | Admit: 2022-07-24 | Discharge: 2022-07-24 | Disposition: A | Discharge status: Home / Self Care | Payer: PPO | Source: Ambulatory Visit | Attending: Obstetrics & Gynecology | Admitting: Obstetrics & Gynecology

## 2022-07-24 DIAGNOSIS — Z803 Family history of malignant neoplasm of breast: Secondary | ICD-10-CM | POA: Diagnosis not present

## 2022-07-24 DIAGNOSIS — Z1239 Encounter for other screening for malignant neoplasm of breast: Secondary | ICD-10-CM | POA: Diagnosis not present

## 2022-07-24 MED ORDER — GADOBUTROL 1 MMOL/ML IV SOLN
8.0000 mL | Freq: Once | INTRAVENOUS | Status: AC | PRN
  Administered 2022-07-24: 8 mL via INTRAVENOUS

## 2022-07-29 ENCOUNTER — Other Ambulatory Visit: Payer: Self-pay | Admitting: Obstetrics & Gynecology

## 2022-07-29 DIAGNOSIS — K769 Liver disease, unspecified: Secondary | ICD-10-CM

## 2022-08-03 ENCOUNTER — Ambulatory Visit
Admission: RE | Admit: 2022-08-03 | Discharge: 2022-08-03 | Disposition: A | Discharge status: Home / Self Care | Payer: PPO | Source: Ambulatory Visit | Attending: Obstetrics & Gynecology | Admitting: Obstetrics & Gynecology

## 2022-08-03 DIAGNOSIS — K769 Liver disease, unspecified: Secondary | ICD-10-CM

## 2022-08-04 ENCOUNTER — Other Ambulatory Visit: Payer: Self-pay | Admitting: Obstetrics & Gynecology

## 2022-08-04 DIAGNOSIS — K769 Liver disease, unspecified: Secondary | ICD-10-CM

## 2022-09-14 DIAGNOSIS — E039 Hypothyroidism, unspecified: Secondary | ICD-10-CM | POA: Diagnosis not present

## 2022-09-21 DIAGNOSIS — E039 Hypothyroidism, unspecified: Secondary | ICD-10-CM | POA: Diagnosis not present

## 2022-12-05 DIAGNOSIS — R7989 Other specified abnormal findings of blood chemistry: Secondary | ICD-10-CM | POA: Diagnosis not present

## 2022-12-05 DIAGNOSIS — R030 Elevated blood-pressure reading, without diagnosis of hypertension: Secondary | ICD-10-CM | POA: Diagnosis not present

## 2022-12-05 DIAGNOSIS — E039 Hypothyroidism, unspecified: Secondary | ICD-10-CM | POA: Diagnosis not present

## 2022-12-12 DIAGNOSIS — E039 Hypothyroidism, unspecified: Secondary | ICD-10-CM | POA: Diagnosis not present

## 2022-12-12 DIAGNOSIS — Z0000 Encounter for general adult medical examination without abnormal findings: Secondary | ICD-10-CM | POA: Diagnosis not present

## 2022-12-12 DIAGNOSIS — Z1331 Encounter for screening for depression: Secondary | ICD-10-CM | POA: Diagnosis not present

## 2022-12-12 DIAGNOSIS — Z1339 Encounter for screening examination for other mental health and behavioral disorders: Secondary | ICD-10-CM | POA: Diagnosis not present

## 2022-12-19 DIAGNOSIS — Z1211 Encounter for screening for malignant neoplasm of colon: Secondary | ICD-10-CM | POA: Diagnosis not present

## 2023-01-16 ENCOUNTER — Other Ambulatory Visit: Payer: Self-pay | Admitting: Obstetrics & Gynecology

## 2023-01-16 DIAGNOSIS — K769 Liver disease, unspecified: Secondary | ICD-10-CM

## 2023-02-14 ENCOUNTER — Ambulatory Visit
Admission: RE | Admit: 2023-02-14 | Discharge: 2023-02-14 | Disposition: A | Discharge status: Home / Self Care | Payer: PPO | Source: Ambulatory Visit | Attending: Obstetrics & Gynecology | Admitting: Obstetrics & Gynecology

## 2023-02-14 DIAGNOSIS — K769 Liver disease, unspecified: Secondary | ICD-10-CM

## 2023-02-14 DIAGNOSIS — K7689 Other specified diseases of liver: Secondary | ICD-10-CM | POA: Diagnosis not present

## 2023-03-06 DIAGNOSIS — Z6826 Body mass index (BMI) 26.0-26.9, adult: Secondary | ICD-10-CM | POA: Diagnosis not present

## 2023-03-06 DIAGNOSIS — B009 Herpesviral infection, unspecified: Secondary | ICD-10-CM | POA: Diagnosis not present

## 2023-03-06 DIAGNOSIS — N951 Menopausal and female climacteric states: Secondary | ICD-10-CM | POA: Diagnosis not present

## 2023-03-06 DIAGNOSIS — Z01419 Encounter for gynecological examination (general) (routine) without abnormal findings: Secondary | ICD-10-CM | POA: Diagnosis not present

## 2023-07-07 DIAGNOSIS — R319 Hematuria, unspecified: Secondary | ICD-10-CM | POA: Diagnosis not present

## 2023-07-07 DIAGNOSIS — R300 Dysuria: Secondary | ICD-10-CM | POA: Diagnosis not present

## 2023-08-17 ENCOUNTER — Encounter: Payer: Self-pay | Admitting: Obstetrics & Gynecology

## 2023-08-17 ENCOUNTER — Other Ambulatory Visit: Payer: Self-pay | Admitting: Obstetrics & Gynecology

## 2023-08-17 DIAGNOSIS — K769 Liver disease, unspecified: Secondary | ICD-10-CM

## 2023-08-21 ENCOUNTER — Ambulatory Visit
Admission: RE | Admit: 2023-08-21 | Discharge: 2023-08-21 | Disposition: A | Discharge status: Home / Self Care | Payer: PPO | Source: Ambulatory Visit | Attending: Obstetrics & Gynecology | Admitting: Obstetrics & Gynecology

## 2023-08-21 DIAGNOSIS — N281 Cyst of kidney, acquired: Secondary | ICD-10-CM | POA: Diagnosis not present

## 2023-08-21 DIAGNOSIS — K769 Liver disease, unspecified: Secondary | ICD-10-CM

## 2023-08-24 ENCOUNTER — Other Ambulatory Visit: Payer: Self-pay

## 2023-08-24 DIAGNOSIS — E0590 Thyrotoxicosis, unspecified without thyrotoxic crisis or storm: Secondary | ICD-10-CM

## 2023-08-28 ENCOUNTER — Ambulatory Visit
Admission: RE | Admit: 2023-08-28 | Discharge: 2023-08-28 | Disposition: A | Discharge status: Home / Self Care | Payer: PPO | Source: Ambulatory Visit | Attending: Endocrinology | Admitting: Endocrinology

## 2023-08-28 DIAGNOSIS — E039 Hypothyroidism, unspecified: Secondary | ICD-10-CM | POA: Diagnosis not present

## 2023-08-28 DIAGNOSIS — E0590 Thyrotoxicosis, unspecified without thyrotoxic crisis or storm: Secondary | ICD-10-CM

## 2023-08-30 ENCOUNTER — Other Ambulatory Visit: Payer: Self-pay | Admitting: Obstetrics & Gynecology

## 2023-08-30 DIAGNOSIS — N289 Disorder of kidney and ureter, unspecified: Secondary | ICD-10-CM

## 2023-09-11 ENCOUNTER — Ambulatory Visit
Admission: RE | Admit: 2023-09-11 | Discharge: 2023-09-11 | Disposition: A | Discharge status: Home / Self Care | Payer: PPO | Source: Ambulatory Visit | Attending: Obstetrics & Gynecology | Admitting: Obstetrics & Gynecology

## 2023-09-11 DIAGNOSIS — N289 Disorder of kidney and ureter, unspecified: Secondary | ICD-10-CM

## 2023-09-11 DIAGNOSIS — K7689 Other specified diseases of liver: Secondary | ICD-10-CM | POA: Diagnosis not present

## 2023-09-11 MED ORDER — GADOPICLENOL 0.5 MMOL/ML IV SOLN
7.0000 mL | Freq: Once | INTRAVENOUS | Status: AC | PRN
  Administered 2023-09-11: 7 mL via INTRAVENOUS

## 2023-09-14 DIAGNOSIS — E039 Hypothyroidism, unspecified: Secondary | ICD-10-CM | POA: Diagnosis not present

## 2023-09-21 DIAGNOSIS — E039 Hypothyroidism, unspecified: Secondary | ICD-10-CM | POA: Diagnosis not present

## 2023-09-21 DIAGNOSIS — Z23 Encounter for immunization: Secondary | ICD-10-CM | POA: Diagnosis not present

## 2023-09-25 ENCOUNTER — Other Ambulatory Visit: Payer: Self-pay | Admitting: Obstetrics & Gynecology

## 2023-09-25 DIAGNOSIS — N289 Disorder of kidney and ureter, unspecified: Secondary | ICD-10-CM

## 2023-12-12 DIAGNOSIS — R7989 Other specified abnormal findings of blood chemistry: Secondary | ICD-10-CM | POA: Diagnosis not present

## 2023-12-12 DIAGNOSIS — E039 Hypothyroidism, unspecified: Secondary | ICD-10-CM | POA: Diagnosis not present

## 2023-12-19 DIAGNOSIS — Z1331 Encounter for screening for depression: Secondary | ICD-10-CM | POA: Diagnosis not present

## 2023-12-19 DIAGNOSIS — E039 Hypothyroidism, unspecified: Secondary | ICD-10-CM | POA: Diagnosis not present

## 2023-12-19 DIAGNOSIS — Z1339 Encounter for screening examination for other mental health and behavioral disorders: Secondary | ICD-10-CM | POA: Diagnosis not present

## 2023-12-19 DIAGNOSIS — Z0000 Encounter for general adult medical examination without abnormal findings: Secondary | ICD-10-CM | POA: Diagnosis not present

## 2024-02-29 ENCOUNTER — Ambulatory Visit
Admission: RE | Admit: 2024-02-29 | Discharge: 2024-02-29 | Disposition: A | Discharge status: Home / Self Care | Source: Ambulatory Visit | Attending: Obstetrics & Gynecology | Admitting: Obstetrics & Gynecology

## 2024-02-29 DIAGNOSIS — N289 Disorder of kidney and ureter, unspecified: Secondary | ICD-10-CM

## 2024-02-29 DIAGNOSIS — K7689 Other specified diseases of liver: Secondary | ICD-10-CM | POA: Diagnosis not present

## 2024-04-08 DIAGNOSIS — Z1231 Encounter for screening mammogram for malignant neoplasm of breast: Secondary | ICD-10-CM | POA: Diagnosis not present

## 2024-04-08 DIAGNOSIS — W57XXXA Bitten or stung by nonvenomous insect and other nonvenomous arthropods, initial encounter: Secondary | ICD-10-CM | POA: Diagnosis not present

## 2024-04-08 DIAGNOSIS — S80861A Insect bite (nonvenomous), right lower leg, initial encounter: Secondary | ICD-10-CM | POA: Diagnosis not present

## 2024-04-08 DIAGNOSIS — N951 Menopausal and female climacteric states: Secondary | ICD-10-CM | POA: Diagnosis not present

## 2024-04-08 DIAGNOSIS — Z6826 Body mass index (BMI) 26.0-26.9, adult: Secondary | ICD-10-CM | POA: Diagnosis not present

## 2024-04-08 DIAGNOSIS — Z01419 Encounter for gynecological examination (general) (routine) without abnormal findings: Secondary | ICD-10-CM | POA: Diagnosis not present

## 2024-04-11 ENCOUNTER — Other Ambulatory Visit: Payer: Self-pay | Admitting: Obstetrics & Gynecology

## 2024-04-11 DIAGNOSIS — R928 Other abnormal and inconclusive findings on diagnostic imaging of breast: Secondary | ICD-10-CM

## 2024-04-24 ENCOUNTER — Other Ambulatory Visit

## 2024-05-07 ENCOUNTER — Other Ambulatory Visit: Payer: Self-pay | Admitting: Obstetrics & Gynecology

## 2024-05-07 ENCOUNTER — Ambulatory Visit
Admission: RE | Admit: 2024-05-07 | Discharge: 2024-05-07 | Disposition: A | Discharge status: Home / Self Care | Source: Ambulatory Visit | Attending: Obstetrics & Gynecology | Admitting: Obstetrics & Gynecology

## 2024-05-07 DIAGNOSIS — R590 Localized enlarged lymph nodes: Secondary | ICD-10-CM | POA: Diagnosis not present

## 2024-05-07 DIAGNOSIS — R599 Enlarged lymph nodes, unspecified: Secondary | ICD-10-CM

## 2024-05-07 DIAGNOSIS — R928 Other abnormal and inconclusive findings on diagnostic imaging of breast: Secondary | ICD-10-CM

## 2024-08-08 ENCOUNTER — Other Ambulatory Visit: Payer: Self-pay | Admitting: Obstetrics & Gynecology

## 2024-08-08 ENCOUNTER — Ambulatory Visit
Admission: RE | Admit: 2024-08-08 | Discharge: 2024-08-08 | Disposition: A | Discharge status: Home / Self Care | Source: Ambulatory Visit | Attending: Obstetrics & Gynecology | Admitting: Obstetrics & Gynecology

## 2024-08-08 ENCOUNTER — Other Ambulatory Visit

## 2024-08-08 DIAGNOSIS — R599 Enlarged lymph nodes, unspecified: Secondary | ICD-10-CM

## 2024-08-08 DIAGNOSIS — R590 Localized enlarged lymph nodes: Secondary | ICD-10-CM | POA: Diagnosis not present

## 2024-08-09 ENCOUNTER — Other Ambulatory Visit

## 2024-08-16 ENCOUNTER — Ambulatory Visit
Admission: RE | Admit: 2024-08-16 | Discharge: 2024-08-16 | Disposition: A | Discharge status: Home / Self Care | Source: Ambulatory Visit | Attending: Obstetrics & Gynecology | Admitting: Obstetrics & Gynecology

## 2024-08-16 DIAGNOSIS — R590 Localized enlarged lymph nodes: Secondary | ICD-10-CM | POA: Diagnosis not present

## 2024-08-16 DIAGNOSIS — R599 Enlarged lymph nodes, unspecified: Secondary | ICD-10-CM | POA: Diagnosis not present

## 2024-08-19 LAB — SURGICAL PATHOLOGY

## 2024-09-16 DIAGNOSIS — E039 Hypothyroidism, unspecified: Secondary | ICD-10-CM | POA: Diagnosis not present

## 2024-09-27 DIAGNOSIS — E039 Hypothyroidism, unspecified: Secondary | ICD-10-CM | POA: Diagnosis not present

## 2024-09-27 DIAGNOSIS — E041 Nontoxic single thyroid nodule: Secondary | ICD-10-CM | POA: Diagnosis not present

## 2024-09-27 DIAGNOSIS — Z23 Encounter for immunization: Secondary | ICD-10-CM | POA: Diagnosis not present

## 2024-09-27 DIAGNOSIS — Z808 Family history of malignant neoplasm of other organs or systems: Secondary | ICD-10-CM | POA: Diagnosis not present
